# Patient Record
Sex: Female | Born: 1970 | Smoking: Never smoker
Health system: Southern US, Community
[De-identification: ages and names within clinical notes are randomized; demographics above are authoritative.]

## PROBLEM LIST (undated history)

## (undated) DIAGNOSIS — I10 Essential (primary) hypertension: Secondary | ICD-10-CM

---

## 2016-08-01 ENCOUNTER — Other Ambulatory Visit: Payer: Self-pay | Admitting: Obstetrics and Gynecology

## 2016-08-01 ENCOUNTER — Other Ambulatory Visit (HOSPITAL_COMMUNITY)
Admission: RE | Admit: 2016-08-01 | Discharge: 2016-08-01 | Disposition: A | Discharge status: Home / Self Care | Payer: 59 | Source: Ambulatory Visit | Attending: Obstetrics and Gynecology | Admitting: Obstetrics and Gynecology

## 2016-08-01 DIAGNOSIS — Z1151 Encounter for screening for human papillomavirus (HPV): Secondary | ICD-10-CM | POA: Insufficient documentation

## 2016-08-01 DIAGNOSIS — Z01419 Encounter for gynecological examination (general) (routine) without abnormal findings: Secondary | ICD-10-CM | POA: Insufficient documentation

## 2016-08-05 LAB — CYTOLOGY - PAP
DIAGNOSIS: NEGATIVE
HPV: NOT DETECTED

## 2019-08-03 ENCOUNTER — Other Ambulatory Visit: Payer: Self-pay

## 2019-08-08 ENCOUNTER — Ambulatory Visit: Payer: 59 | Attending: Internal Medicine

## 2019-08-08 DIAGNOSIS — Z20822 Contact with and (suspected) exposure to covid-19: Secondary | ICD-10-CM

## 2019-08-10 LAB — NOVEL CORONAVIRUS, NAA: SARS-CoV-2, NAA: DETECTED — AB

## 2020-02-01 ENCOUNTER — Other Ambulatory Visit: Payer: Self-pay | Admitting: Obstetrics and Gynecology

## 2020-02-01 DIAGNOSIS — Z1231 Encounter for screening mammogram for malignant neoplasm of breast: Secondary | ICD-10-CM

## 2020-02-06 ENCOUNTER — Other Ambulatory Visit: Payer: Self-pay

## 2020-02-06 ENCOUNTER — Ambulatory Visit
Admission: RE | Admit: 2020-02-06 | Discharge: 2020-02-06 | Disposition: A | Discharge status: Home / Self Care | Payer: 59 | Source: Ambulatory Visit

## 2020-02-06 DIAGNOSIS — Z1231 Encounter for screening mammogram for malignant neoplasm of breast: Secondary | ICD-10-CM

## 2020-08-12 ENCOUNTER — Encounter (HOSPITAL_COMMUNITY): Payer: Self-pay

## 2020-08-12 ENCOUNTER — Emergency Department (HOSPITAL_COMMUNITY)
Admission: EM | Admit: 2020-08-12 | Discharge: 2020-08-12 | Disposition: A | Discharge status: Home / Self Care | Payer: 59 | Attending: Emergency Medicine | Admitting: Emergency Medicine

## 2020-08-12 ENCOUNTER — Other Ambulatory Visit: Payer: Self-pay

## 2020-08-12 DIAGNOSIS — I10 Essential (primary) hypertension: Secondary | ICD-10-CM | POA: Insufficient documentation

## 2020-08-12 DIAGNOSIS — L509 Urticaria, unspecified: Secondary | ICD-10-CM | POA: Insufficient documentation

## 2020-08-12 HISTORY — DX: Essential (primary) hypertension: I10

## 2020-08-12 LAB — CBC
HCT: 49.2 % — ABNORMAL HIGH (ref 36.0–46.0)
Hemoglobin: 16 g/dL — ABNORMAL HIGH (ref 12.0–15.0)
MCH: 30.4 pg (ref 26.0–34.0)
MCHC: 32.5 g/dL (ref 30.0–36.0)
MCV: 93.4 fL (ref 80.0–100.0)
Platelets: 290 10*3/uL (ref 150–400)
RBC: 5.27 MIL/uL — ABNORMAL HIGH (ref 3.87–5.11)
RDW: 12.3 % (ref 11.5–15.5)
WBC: 12.5 10*3/uL — ABNORMAL HIGH (ref 4.0–10.5)
nRBC: 0 % (ref 0.0–0.2)

## 2020-08-12 LAB — CBC WITH DIFFERENTIAL/PLATELET
Abs Immature Granulocytes: 0.04 10*3/uL (ref 0.00–0.07)
Basophils Absolute: 0 10*3/uL (ref 0.0–0.1)
Basophils Relative: 0 %
Eosinophils Absolute: 0.1 10*3/uL (ref 0.0–0.5)
Eosinophils Relative: 1 %
HCT: 49.4 % — ABNORMAL HIGH (ref 36.0–46.0)
Hemoglobin: 16 g/dL — ABNORMAL HIGH (ref 12.0–15.0)
Immature Granulocytes: 0 %
Lymphocytes Relative: 17 %
Lymphs Abs: 2.1 10*3/uL (ref 0.7–4.0)
MCH: 30.4 pg (ref 26.0–34.0)
MCHC: 32.4 g/dL (ref 30.0–36.0)
MCV: 93.7 fL (ref 80.0–100.0)
Monocytes Absolute: 0.4 10*3/uL (ref 0.1–1.0)
Monocytes Relative: 3 %
Neutro Abs: 9.6 10*3/uL — ABNORMAL HIGH (ref 1.7–7.7)
Neutrophils Relative %: 79 %
Platelets: 293 10*3/uL (ref 150–400)
RBC: 5.27 MIL/uL — ABNORMAL HIGH (ref 3.87–5.11)
RDW: 12.4 % (ref 11.5–15.5)
WBC: 12.3 10*3/uL — ABNORMAL HIGH (ref 4.0–10.5)
nRBC: 0 % (ref 0.0–0.2)

## 2020-08-12 LAB — COMPREHENSIVE METABOLIC PANEL
ALT: 21 U/L (ref 0–44)
AST: 22 U/L (ref 15–41)
Albumin: 4.1 g/dL (ref 3.5–5.0)
Alkaline Phosphatase: 75 U/L (ref 38–126)
Anion gap: 12 (ref 5–15)
BUN: 11 mg/dL (ref 6–20)
CO2: 20 mmol/L — ABNORMAL LOW (ref 22–32)
Calcium: 8.7 mg/dL — ABNORMAL LOW (ref 8.9–10.3)
Chloride: 104 mmol/L (ref 98–111)
Creatinine, Ser: 0.77 mg/dL (ref 0.44–1.00)
GFR, Estimated: >60 mL/min (ref >60)
Glucose, Bld: 162 mg/dL — ABNORMAL HIGH (ref 70–99)
Potassium: 4 mmol/L (ref 3.5–5.1)
Sodium: 136 mmol/L (ref 135–145)
Total Bilirubin: 0.8 mg/dL (ref 0.3–1.2)
Total Protein: 7.1 g/dL (ref 6.5–8.1)

## 2020-08-12 MED ORDER — HYDROXYZINE HCL 25 MG PO TABS: 25.0000 mg | ORAL_TABLET | Freq: Four times a day (QID) | ORAL | 0 refills | Status: AC

## 2020-08-12 MED ORDER — FAMOTIDINE 20 MG PO TABS: 20.0000 mg | ORAL_TABLET | Freq: Two times a day (BID) | ORAL | 0 refills | Status: AC

## 2020-08-12 MED ORDER — SODIUM CHLORIDE 0.9 % IV BOLUS
1000.0000 mL | Freq: Once | INTRAVENOUS | Status: AC
  Administered 2020-08-12: 1000 mL via INTRAVENOUS

## 2020-08-12 MED ORDER — EPINEPHRINE 0.3 MG/0.3ML IJ SOAJ: 0.3000 mg | Freq: Once | INTRAMUSCULAR | 1 refills | Status: AC | PRN

## 2020-08-12 MED ORDER — LEVOCETIRIZINE DIHYDROCHLORIDE 5 MG PO TABS: 5.0000 mg | ORAL_TABLET | Freq: Every evening | ORAL | 0 refills | Status: AC

## 2020-08-12 MED ORDER — FAMOTIDINE IN NACL 20-0.9 MG/50ML-% IV SOLN
20.0000 mg | Freq: Once | INTRAVENOUS | Status: AC
  Administered 2020-08-12: 20 mg via INTRAVENOUS
  Filled 2020-08-12: qty 50

## 2020-08-12 MED ORDER — DIPHENHYDRAMINE HCL 25 MG PO CAPS
25.0000 mg | ORAL_CAPSULE | Freq: Once | ORAL | Status: AC
  Administered 2020-08-12: 25 mg via ORAL
  Filled 2020-08-12: qty 1

## 2020-08-12 MED ORDER — METHYLPREDNISOLONE 4 MG PO TBPK: ORAL_TABLET | ORAL | 0 refills | Status: AC

## 2020-08-12 MED ORDER — METHYLPREDNISOLONE SODIUM SUCC 125 MG IJ SOLR
125.0000 mg | Freq: Once | INTRAMUSCULAR | Status: AC
  Administered 2020-08-12: 125 mg via INTRAVENOUS
  Filled 2020-08-12: qty 2

## 2020-08-12 NOTE — ED Notes (Signed)
Pt arrived to hallway 12 with redness and welts all over her body. Her right eye was so swollen that it was almost shut. No respiratory distress, no respiratory compromise. Pt able to complete sentences without any issues. Pt said her lips were swollen on arrival but got better after benadryl was given at triage.

## 2020-08-12 NOTE — ED Notes (Signed)
Patient placed on monitor, Pulse Ox.

## 2020-08-12 NOTE — ED Triage Notes (Addendum)
Pt BIB husband due to allergic reaction . Pt  reports she notice x2 weeks ago that she had 2 bumps on her back and it went away. Pt reports she thought she got bit by a spider. Pt states Friday the itching got worse. Pt reports Friday that she got a burning sensation and itching. Pt reports today the rash is worse and has spread to her entire body. Pt reports she hasn't changed foods, laundry detergent or soaps.Pt airway intact.Pt is able to speak in complete sentences. Pt refused interpreter.

## 2020-08-12 NOTE — Discharge Instructions (Signed)
  General instructions Do not take hot showers or baths. This can make itching worse. Do not wear tight-fitting clothing. Use sunscreen and wear protective clothing when you are outside. Avoid any substances that cause your hives. Keep a journal to help track what causes your hives. Write down: What medicines you take. What you eat and drink. What products you use on your skin. Keep all follow-up visits as told by your health care provider. This is important. Contact a health care provider if: Your symptoms are not controlled with medicine. Your joints are painful or swollen. Get help right away if: You have a fever. You have pain in your abdomen. Your tongue or lips are swollen. Your eyelids are swollen. Your chest or throat feels tight. You have trouble breathing or swallowing.

## 2020-08-12 NOTE — ED Provider Notes (Signed)
[facility] EMERGENCY DEPARTMENT Provider Note   CSN: [id number] Arrival date & time: 08/12/20  4166     History Chief Complaint  Patient presents with  . Allergic Reaction    Kara Shere Sr. is a 50 y.o. female who presents with chief complaint of hives.  The patient has had 1 previous episode of this back in November but this was reportedly only to her scalp and occurred after changing laundry detergents.  This resolved within 24 hours.  Patient had onset of hives 2 days ago which have progressively worsened on the scalp face lips neck torso and extremities.  She denies wheezing, coughing or shortness of breath.  She has no previous known allergies.  She is unsure what caused the onset of these 2 days ago.  She denies fevers, chills.  She denies nausea, vomiting or syncope.  HPI     Past Medical History:  Diagnosis Date  . Hypertension     There are no problems to display for this patient.   History reviewed. No pertinent surgical history.   OB History   No obstetric history on file.     History reviewed. No pertinent family history.  Social History   Tobacco Use  . Smoking status: Never Smoker  . Smokeless tobacco: Never Used    Home Medications Prior to Admission medications   Medication Sig Start Date End Date Taking? Authorizing Provider  EPINEPHrine (EPIPEN 2-PAK) 0.3 mg/0.3 mL IJ SOAJ injection Inject 0.3 mg into the muscle once as needed for up to 1 dose (for severe allergic reaction). CAll 911 immediately if you have to use this medicine 08/12/20  Yes Irina Okelly, PA-C  famotidine (PEPCID) 20 MG tablet Take 1 tablet (20 mg total) by mouth 2 (two) times daily. 08/12/20  Yes Jaylene Schrom, PA-C  hydrOXYzine (ATARAX/VISTARIL) 25 MG tablet Take 1 tablet (25 mg total) by mouth every 6 (six) hours. 08/12/20  Yes Amran Malter, PA-C  levocetirizine (XYZAL) 5 MG tablet Take 1 tablet (5 mg total) by mouth every evening. 08/12/20   Yes Arthor Captain, PA-C  methylPREDNISolone (MEDROL DOSEPAK) 4 MG TBPK tablet Use as directed 08/12/20  Yes Arthor Captain, PA-C    Allergies    Patient has no allergy information on record.  Review of Systems   Review of Systems Ten systems reviewed and are negative for acute change, except as noted in the HPI.   Physical Exam Updated Vital Signs BP 133/79   Pulse 68   Temp 98.5 F (36.9 C) (Oral)   Resp 16   LMP 08/06/2020   SpO2 98%   Physical Exam Vitals and nursing note reviewed.  Constitutional:      General: She is not in acute distress.    Appearance: She is well-developed and well-nourished. She is not diaphoretic.  HENT:     Head: Normocephalic and atraumatic.  Eyes:     General: No scleral icterus.    Conjunctiva/sclera: Conjunctivae normal.  Cardiovascular:     Rate and Rhythm: Normal rate and regular rhythm.     Heart sounds: Normal heart sounds. No murmur heard. No friction rub. No gallop.   Pulmonary:     Effort: Pulmonary effort is normal. No respiratory distress.     Breath sounds: Normal breath sounds. No wheezing.  Abdominal:     General: Bowel sounds are normal. There is no distension.     Palpations: Abdomen is soft. There is no mass.     Tenderness: There  is no abdominal tenderness. There is no guarding.  Musculoskeletal:     Cervical back: Normal range of motion.  Skin:    General: Skin is warm and dry.     Findings: Rash present. Rash is urticarial.     Comments: Diffuse confluent hives over the face, scalp, neck and upper torso.  There are some singular wheals on the distal upper extremities.  Confluent hives covering the thorax.  Again noted are singular wheals over the lower extremities.  No purpura, no palmar lesions.  Neurological:     Mental Status: She is alert and oriented to person, place, and time.  Psychiatric:        Behavior: Behavior normal.     ED Results / Procedures / Treatments   Labs (all labs ordered are listed,  but only abnormal results are displayed) Labs Reviewed  CBC - Abnormal; Notable for the following components:      Result Value   WBC 12.5 (*)    RBC 5.27 (*)    Hemoglobin 16.0 (*)    HCT 49.2 (*)    All other components within normal limits  COMPREHENSIVE METABOLIC PANEL - Abnormal; Notable for the following components:   CO2 20 (*)    Glucose, Bld 162 (*)    Calcium 8.7 (*)    All other components within normal limits  CBC WITH DIFFERENTIAL/PLATELET - Abnormal; Notable for the following components:   WBC 12.3 (*)    RBC 5.27 (*)    Hemoglobin 16.0 (*)    HCT 49.4 (*)    Neutro Abs 9.6 (*)    All other components within normal limits    EKG EKG Interpretation  Date/Time:  Sunday August 12 2020 07:04:41 EST Ventricular Rate:  124 PR Interval:  148 QRS Duration: 78 QT Interval:  308 QTC Calculation: 442 R Axis:   58 Text Interpretation: Sinus tachycardia Septal infarct , age undetermined No previous tracing Confirmed by Gwyneth Sprout ([id number]) on 08/12/2020 10:09:45 AM   Radiology No results found.  Procedures Procedures   Medications Ordered in ED Medications  diphenhydrAMINE (BENADRYL) capsule 25 mg (25 mg Oral Given 08/12/20 0707)  methylPREDNISolone sodium succinate (SOLU-MEDROL) 125 mg/2 mL injection 125 mg (125 mg Intravenous Given 08/12/20 0825)  sodium chloride 0.9 % bolus 1,000 mL (0 mLs Intravenous Stopped 08/12/20 0920)  famotidine (PEPCID) IVPB 20 mg premix (0 mg Intravenous Stopped 08/12/20 3335)    ED Course  I have reviewed the triage vital signs and the nursing notes.  Pertinent labs & imaging results that were available during my care of the patient were reviewed by me and considered in my medical decision making (see chart for details).  Clinical Course as of 08/12/20 1337  Sun Aug 12, 2020  0915 A febrile with normal eosinophil count- Doubt DRESS  [AH]    Clinical Course User Index [AH] Arthor Captain, PA-C   MDM Rules/Calculators/A&P                           Patient here with Diffuse hives outbreak. No evidence of ananphylaxis. Paitent initially tachycardic but sxs resolved after fluids, tx with solumedrol, benadryl, pepcid. Patient's symptoms have improved significantly.  I doubt any other emergent cause such as dress, SJS, TENS, contact dermatitis.  I ordered and reviewed labs which include CBC which shows slightly elevated white blood cell count no any eosinophilia, CMP with mildly elevated blood glucose 230 likely acute phase reactants.  EKG initially shows sinus tachycardia at a rate of 124. Patient seen and shared visit.  Will discharge with an EpiPen to have should her symptoms worsen, Medrol Dosepak, Pepcid, Zyrtec, Atarax as needed for itching.  Discussed outpatient follow-up and return precautions.  Final Clinical Impression(s) / ED Diagnoses Final diagnoses:  Urticaria    Rx / DC Orders ED Discharge Orders         Ordered    methylPREDNISolone (MEDROL DOSEPAK) 4 MG TBPK tablet        08/12/20 1122    hydrOXYzine (ATARAX/VISTARIL) 25 MG tablet  Every 6 hours        08/12/20 1122    famotidine (PEPCID) 20 MG tablet  2 times daily        08/12/20 1122    levocetirizine (XYZAL) 5 MG tablet  Every evening        08/12/20 1127    EPINEPHrine (EPIPEN 2-PAK) 0.3 mg/0.3 mL IJ SOAJ injection  Once PRN        08/12/20 1128           Arthor Captain, PA-C 08/12/20 1337    Gwyneth Sprout, MD 08/16/20 [phone]

## 2021-03-06 ENCOUNTER — Other Ambulatory Visit: Payer: Self-pay | Admitting: Obstetrics and Gynecology

## 2021-03-06 DIAGNOSIS — Z1231 Encounter for screening mammogram for malignant neoplasm of breast: Secondary | ICD-10-CM

## 2021-03-08 ENCOUNTER — Ambulatory Visit
Admission: RE | Admit: 2021-03-08 | Discharge: 2021-03-08 | Disposition: A | Discharge status: Home / Self Care | Payer: 59 | Source: Ambulatory Visit | Attending: Obstetrics and Gynecology | Admitting: Obstetrics and Gynecology

## 2021-03-08 ENCOUNTER — Other Ambulatory Visit: Payer: Self-pay

## 2021-03-08 DIAGNOSIS — Z1231 Encounter for screening mammogram for malignant neoplasm of breast: Secondary | ICD-10-CM

## 2021-12-30 ENCOUNTER — Ambulatory Visit (INDEPENDENT_AMBULATORY_CARE_PROVIDER_SITE_OTHER): Payer: 59

## 2021-12-30 ENCOUNTER — Ambulatory Visit (INDEPENDENT_AMBULATORY_CARE_PROVIDER_SITE_OTHER): Payer: 59 | Admitting: Podiatry

## 2021-12-30 DIAGNOSIS — M21612 Bunion of left foot: Secondary | ICD-10-CM | POA: Diagnosis not present

## 2021-12-30 DIAGNOSIS — M21619 Bunion of unspecified foot: Secondary | ICD-10-CM

## 2021-12-30 DIAGNOSIS — M216X1 Other acquired deformities of right foot: Secondary | ICD-10-CM | POA: Diagnosis not present

## 2021-12-30 DIAGNOSIS — M7662 Achilles tendinitis, left leg: Secondary | ICD-10-CM | POA: Diagnosis not present

## 2021-12-30 DIAGNOSIS — M2011 Hallux valgus (acquired), right foot: Secondary | ICD-10-CM | POA: Diagnosis not present

## 2021-12-30 DIAGNOSIS — M7732 Calcaneal spur, left foot: Secondary | ICD-10-CM | POA: Diagnosis not present

## 2021-12-30 DIAGNOSIS — M7661 Achilles tendinitis, right leg: Secondary | ICD-10-CM | POA: Diagnosis not present

## 2021-12-30 DIAGNOSIS — M216X2 Other acquired deformities of left foot: Secondary | ICD-10-CM

## 2021-12-30 MED ORDER — MELOXICAM 7.5 MG PO TABS: 7.5000 mg | ORAL_TABLET | Freq: Every day | ORAL | 0 refills | Status: AC | PRN

## 2021-12-30 NOTE — Patient Instructions (Signed)

## 2022-01-08 ENCOUNTER — Other Ambulatory Visit: Payer: Self-pay | Admitting: Podiatry

## 2022-01-08 DIAGNOSIS — M2011 Hallux valgus (acquired), right foot: Secondary | ICD-10-CM

## 2022-02-24 ENCOUNTER — Ambulatory Visit: Payer: 59 | Admitting: Podiatry

## 2022-03-14 ENCOUNTER — Other Ambulatory Visit: Payer: Self-pay | Admitting: Obstetrics and Gynecology

## 2022-03-14 DIAGNOSIS — Z1231 Encounter for screening mammogram for malignant neoplasm of breast: Secondary | ICD-10-CM

## 2022-03-20 ENCOUNTER — Ambulatory Visit
Admission: RE | Admit: 2022-03-20 | Discharge: 2022-03-20 | Disposition: A | Discharge status: Home / Self Care | Payer: 59 | Source: Ambulatory Visit | Attending: Obstetrics and Gynecology | Admitting: Obstetrics and Gynecology

## 2022-03-20 DIAGNOSIS — Z1231 Encounter for screening mammogram for malignant neoplasm of breast: Secondary | ICD-10-CM

## 2022-11-28 ENCOUNTER — Other Ambulatory Visit: Payer: Self-pay | Admitting: Obstetrics and Gynecology

## 2022-11-28 DIAGNOSIS — Z1231 Encounter for screening mammogram for malignant neoplasm of breast: Secondary | ICD-10-CM

## 2023-03-23 ENCOUNTER — Ambulatory Visit
Admission: RE | Admit: 2023-03-23 | Discharge: 2023-03-23 | Disposition: A | Discharge status: Home / Self Care | Payer: 59 | Source: Ambulatory Visit | Attending: Obstetrics and Gynecology | Admitting: Obstetrics and Gynecology

## 2023-03-23 DIAGNOSIS — Z1231 Encounter for screening mammogram for malignant neoplasm of breast: Secondary | ICD-10-CM

## 2023-04-01 IMAGING — MG MM DIGITAL SCREENING BILAT W/ TOMO AND CAD
8 series · 8 of 24 positions shown · non-contrast
Comparison: Previous exam(s).

CLINICAL DATA: Screening.

EXAM:
DIGITAL SCREENING BILATERAL MAMMOGRAM WITH TOMOSYNTHESIS AND CAD
TECHNIQUE: Bilateral screening digital craniocaudal and mediolateral oblique
mammograms were obtained. Bilateral screening digital breast
tomosynthesis was performed. The images were evaluated with
computer-aided detection.

[L MLO synth-2D]
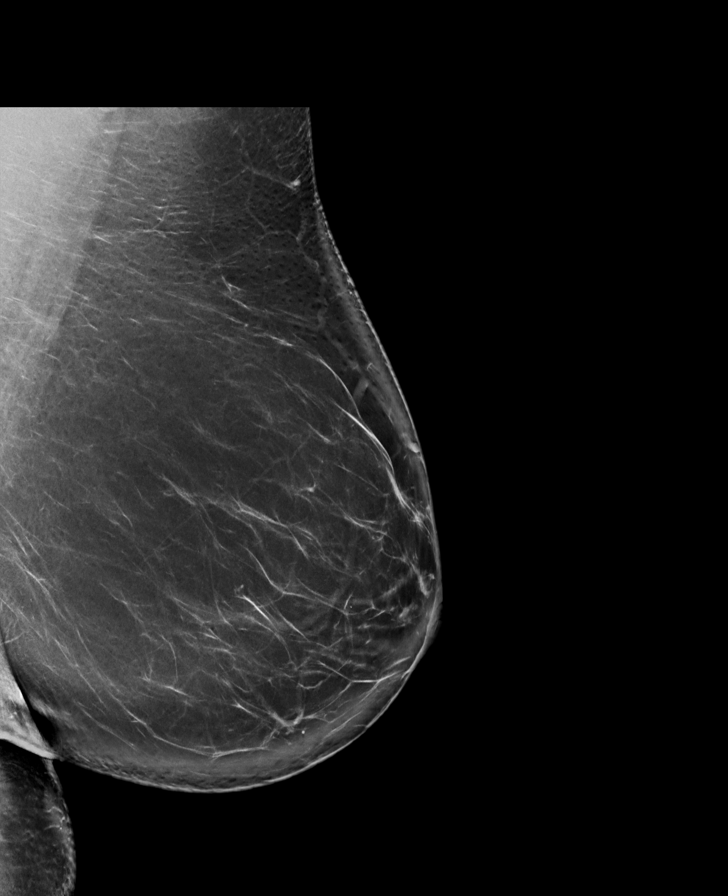

[R MLO synth-2D]
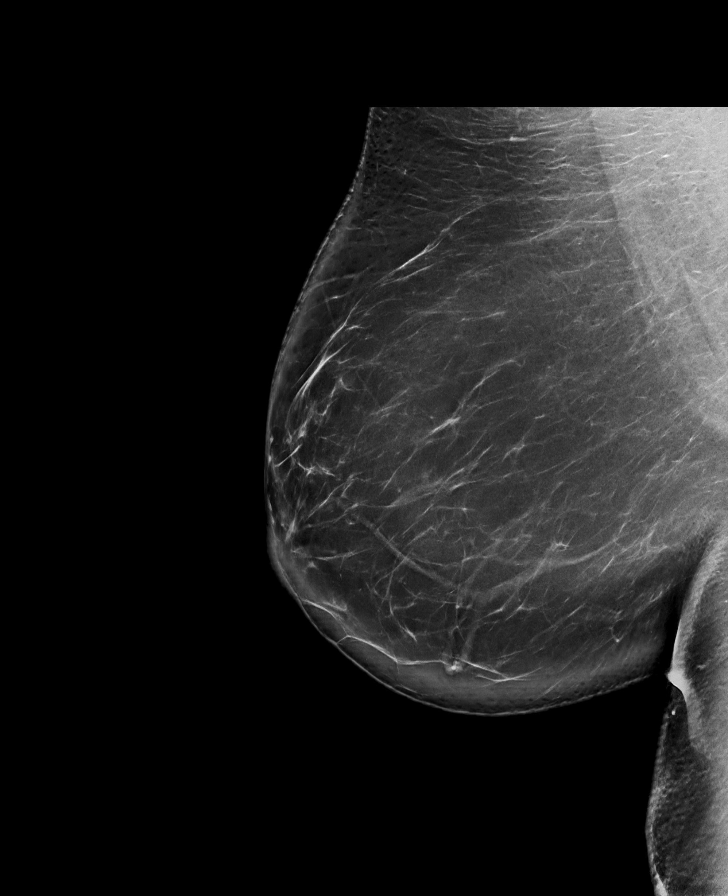

[L CC synth-2D]
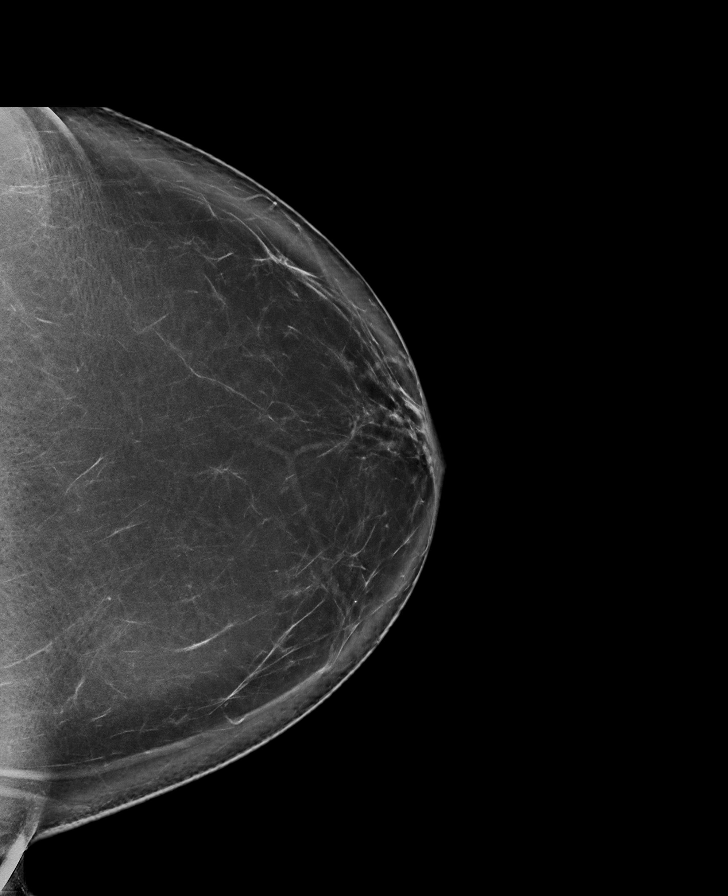

[R CC synth-2D]
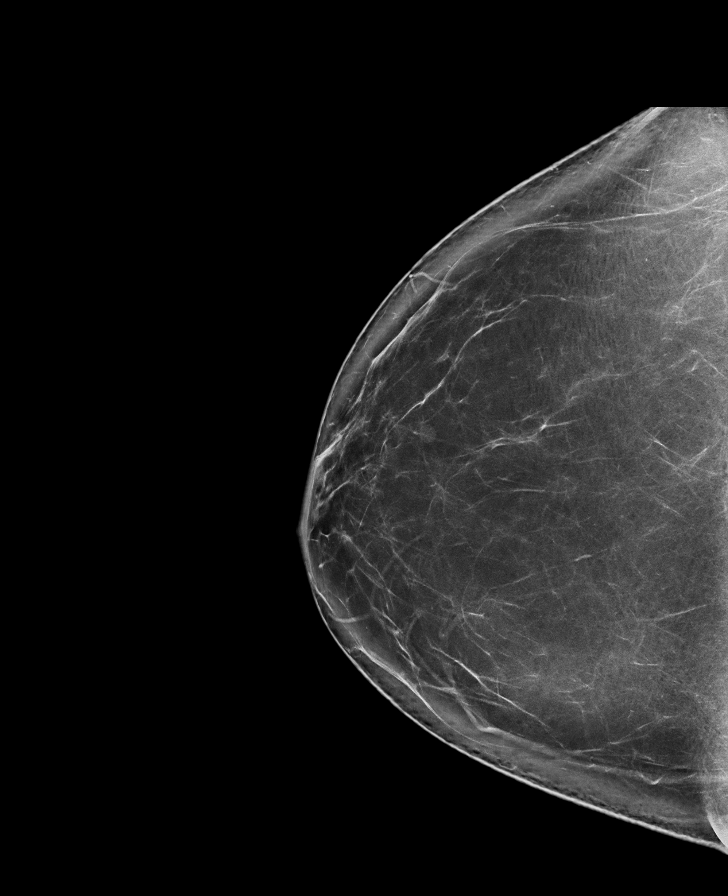

[R MLO tomo · tomo slice 56/111.0]
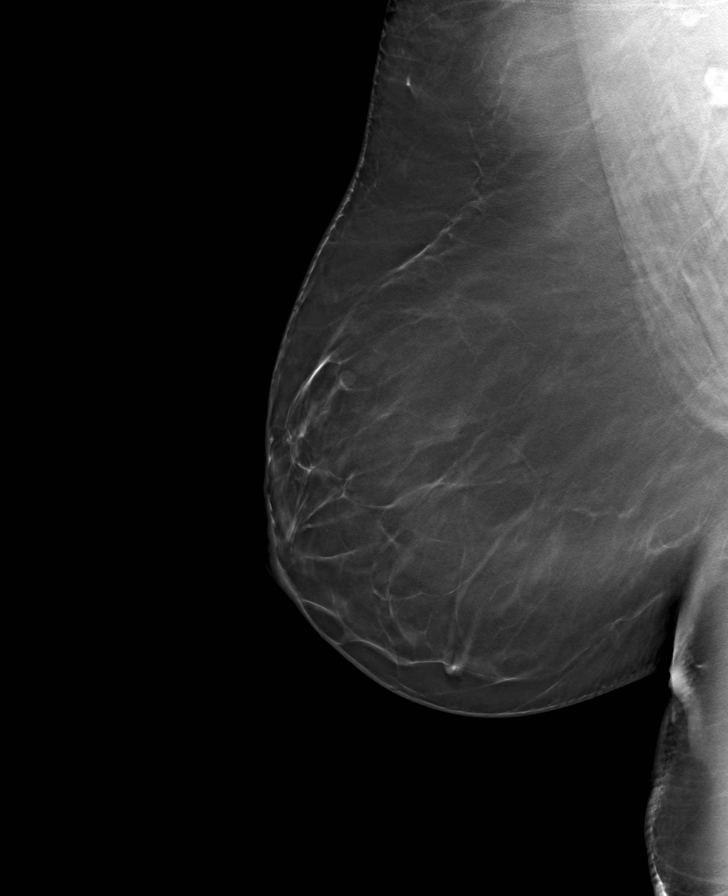

[L MLO tomo · tomo slice 55/110.0]
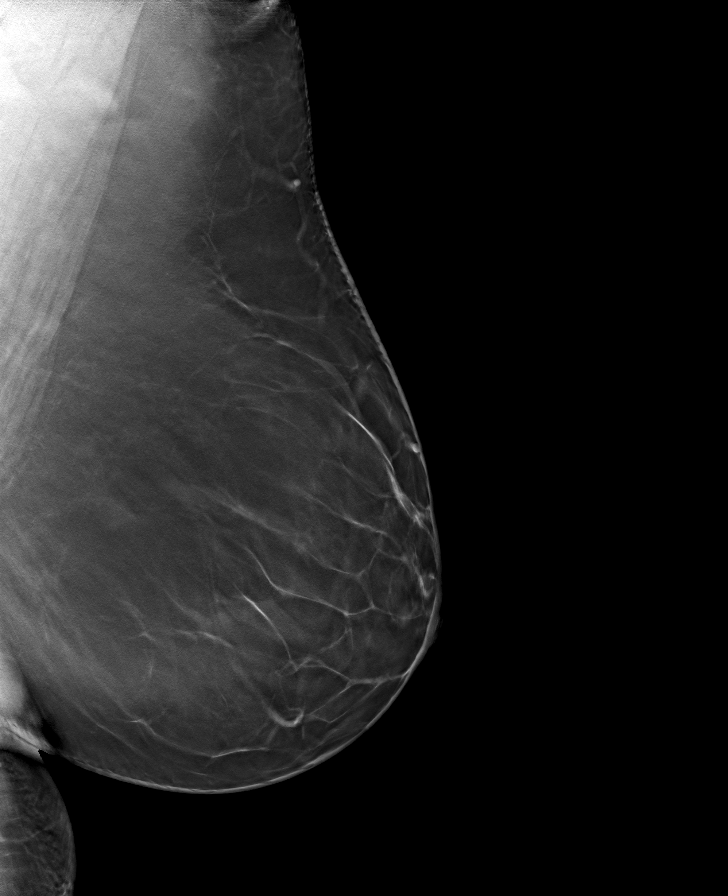

[R CC tomo · tomo slice 51/100.0]
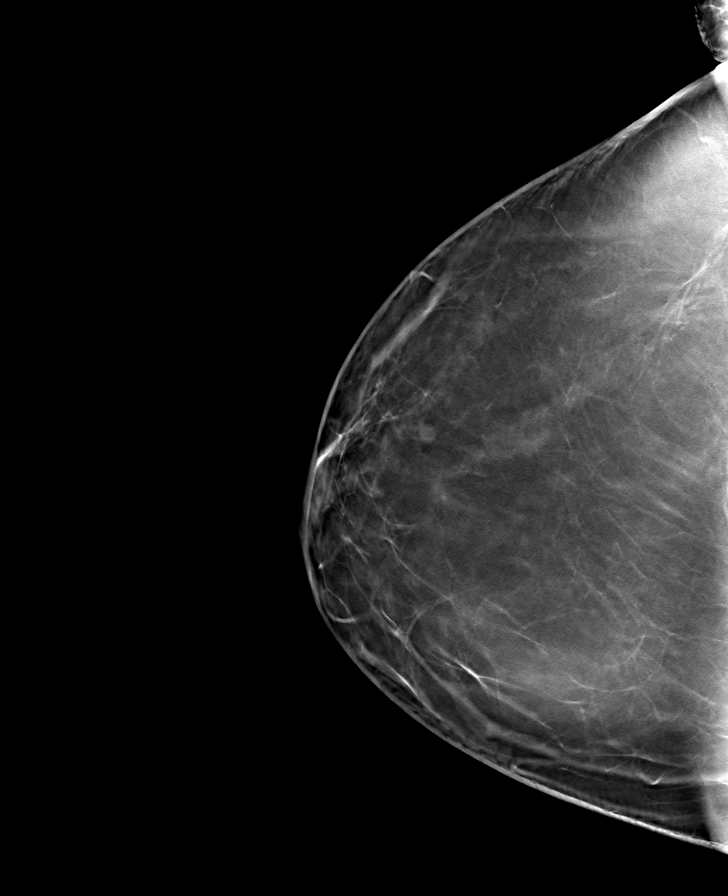

[L CC tomo · tomo slice 51/102.0]
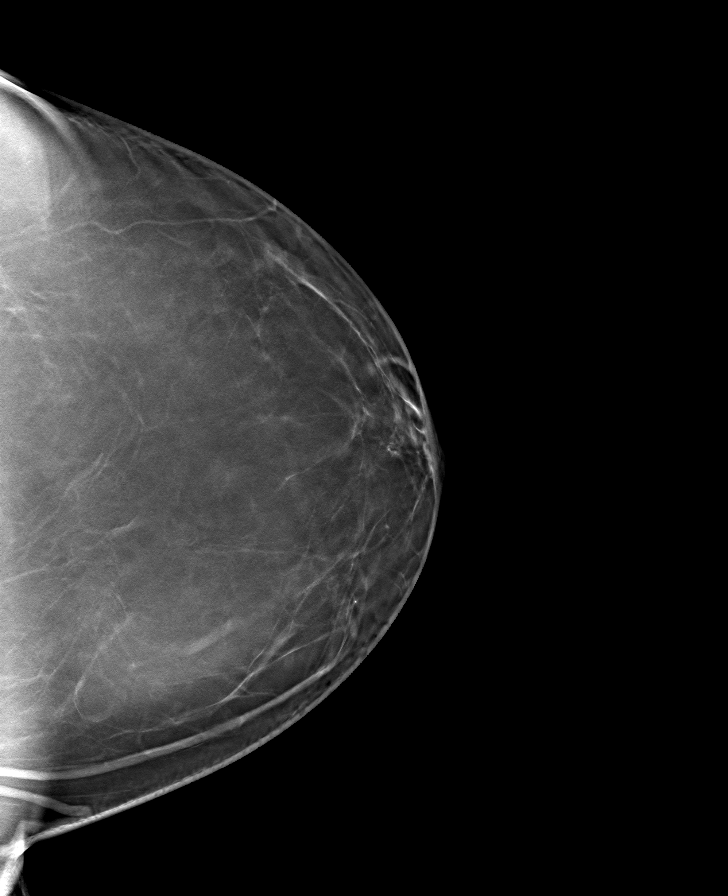

[8 of 24 positions shown; findings below may reference images not displayed]

ACR Breast Density Category b: There are scattered areas of
fibroglandular density.
FINDINGS: There are no findings suspicious for malignancy.
IMPRESSION: No mammographic evidence of malignancy. A result letter of this
screening mammogram will be mailed directly to the patient.

RECOMMENDATION:
Screening mammogram in one year. (Code:51-O-LD2)

BI-RADS CATEGORY  1: Negative.

## 2023-09-21 ENCOUNTER — Other Ambulatory Visit: Payer: Self-pay | Admitting: Obstetrics and Gynecology

## 2023-09-21 DIAGNOSIS — Z1231 Encounter for screening mammogram for malignant neoplasm of breast: Secondary | ICD-10-CM

## 2024-03-23 ENCOUNTER — Ambulatory Visit
Admission: RE | Admit: 2024-03-23 | Discharge: 2024-03-23 | Disposition: A | Discharge status: Home / Self Care | Source: Ambulatory Visit | Attending: Obstetrics and Gynecology | Admitting: Obstetrics and Gynecology

## 2024-03-23 DIAGNOSIS — Z1231 Encounter for screening mammogram for malignant neoplasm of breast: Secondary | ICD-10-CM

## 2024-03-31 ENCOUNTER — Other Ambulatory Visit: Payer: Self-pay | Admitting: Obstetrics and Gynecology

## 2024-03-31 ENCOUNTER — Other Ambulatory Visit (HOSPITAL_COMMUNITY)
Admission: RE | Admit: 2024-03-31 | Discharge: 2024-03-31 | Disposition: A | Discharge status: Home / Self Care | Source: Ambulatory Visit | Attending: Obstetrics and Gynecology | Admitting: Obstetrics and Gynecology

## 2024-03-31 DIAGNOSIS — Z01419 Encounter for gynecological examination (general) (routine) without abnormal findings: Secondary | ICD-10-CM | POA: Insufficient documentation

## 2024-04-04 LAB — CYTOLOGY - PAP
Comment: NEGATIVE
Diagnosis: NEGATIVE
High risk HPV: NEGATIVE

## 2024-04-11 ENCOUNTER — Emergency Department (HOSPITAL_BASED_OUTPATIENT_CLINIC_OR_DEPARTMENT_OTHER)

## 2024-04-11 ENCOUNTER — Emergency Department (HOSPITAL_BASED_OUTPATIENT_CLINIC_OR_DEPARTMENT_OTHER): Admitting: Radiology

## 2024-04-11 ENCOUNTER — Other Ambulatory Visit: Payer: Self-pay

## 2024-04-11 ENCOUNTER — Emergency Department (HOSPITAL_BASED_OUTPATIENT_CLINIC_OR_DEPARTMENT_OTHER): Admission: EM | Admit: 2024-04-11 | Discharge: 2024-04-11 | Disposition: A | Discharge status: Home / Self Care

## 2024-04-11 ENCOUNTER — Encounter (HOSPITAL_BASED_OUTPATIENT_CLINIC_OR_DEPARTMENT_OTHER): Payer: Self-pay | Admitting: Urology

## 2024-04-11 DIAGNOSIS — I10 Essential (primary) hypertension: Secondary | ICD-10-CM | POA: Insufficient documentation

## 2024-04-11 DIAGNOSIS — R202 Paresthesia of skin: Secondary | ICD-10-CM | POA: Insufficient documentation

## 2024-04-11 LAB — BASIC METABOLIC PANEL WITH GFR
Anion gap: 15 (ref 5–15)
BUN: 13 mg/dL (ref 6–20)
CO2: 22 mmol/L (ref 22–32)
Calcium: 9.7 mg/dL (ref 8.9–10.3)
Chloride: 102 mmol/L (ref 98–111)
Creatinine, Ser: 0.79 mg/dL (ref 0.44–1.00)
GFR, Estimated: >60 mL/min (ref >60)
Glucose, Bld: 92 mg/dL (ref 70–99)
Potassium: 3.9 mmol/L (ref 3.5–5.1)
Sodium: 139 mmol/L (ref 135–145)

## 2024-04-11 LAB — CBC
HCT: 42 % (ref 36.0–46.0)
Hemoglobin: 14.2 g/dL (ref 12.0–15.0)
MCH: 30.9 pg (ref 26.0–34.0)
MCHC: 33.8 g/dL (ref 30.0–36.0)
MCV: 91.5 fL (ref 80.0–100.0)
Platelets: 217 K/uL (ref 150–400)
RBC: 4.59 MIL/uL (ref 3.87–5.11)
RDW: 12.5 % (ref 11.5–15.5)
WBC: 8.7 K/uL (ref 4.0–10.5)
nRBC: 0 % (ref 0.0–0.2)

## 2024-04-11 LAB — TROPONIN T, HIGH SENSITIVITY: Troponin T High Sensitivity: <15 ng/L (ref 0–19)

## 2024-04-11 MED ORDER — METHOCARBAMOL 500 MG PO TABS: 500.0000 mg | ORAL_TABLET | Freq: Two times a day (BID) | ORAL | 0 refills | Status: AC

## 2024-04-11 NOTE — ED Notes (Signed)
 Provider at bedside for MSE.

## 2024-04-11 NOTE — ED Notes (Signed)
 Attempt at IV x 2 with no result, provider aware

## 2024-04-11 NOTE — ED Provider Triage Note (Signed)
 Emergency Medicine Provider Triage Evaluation Note  Kara Booth , a 52 y.o. female  was evaluated in triage.  Pt complains of intermittent L arm tingling radiating from L shoulder x 3 days accompanied by  headache, R sided. Family member at bedside who relays story.   Family concerned for MI and want cardiac labs.   Denies blurry vision, vertigo, leg weakness, chest pain, shortness of breath, abdominal pain, n/v/d, dysuria, LE swelling   Review of Systems  Positive: N/a Negative: N/a  Physical Exam  BP (!) 154/101   Pulse 86   Temp 98.4 F (36.9 C)   Resp 16   Ht 5' 3 (1.6 m)   Wt 102.1 kg   LMP 08/06/2020   SpO2 100%   BMI 39.86 kg/m  Gen:   Awake, no distress   Resp:  Normal effort  MSK:   Moves extremities without difficulty  Other:  Normal neuro exam, normal coordination, grip strength, hip/knee/elbow extension and flexion, no nystagmus, no arm drift. No aphasia or apraxia.   Medical Decision Making  Medically screening exam initiated at 4:51 PM.  Appropriate orders placed.  Kara Booth was informed that the remainder of the evaluation will be completed by another provider, this initial triage assessment does not replace that evaluation, and the importance of remaining in the ED until their evaluation is complete.     Kara Booth, [place] 04/11/24 1655

## 2024-04-11 NOTE — ED Triage Notes (Signed)
 Pt family states pt started having weird feeling in left arm on Friday  Went away over the weekend, also had headache  States left arm numbness and left neck numbness that started this am.  Take metoprolol for HTN

## 2024-04-11 NOTE — Discharge Instructions (Signed)
 You were seen today for tingling sensation to left arm.  Your lab work, imaging and physical exam today were reassuring and low suspicion for any emergent cause recent today.  Will recommend you continue to still follow-up with your PCP for further evaluation.  I sent in a muscle relaxer for you to use as needed as well as additionally you can use Tylenol for additional relief.  Recommend heat over the shoulder and stretching to help relieve the sensations.  Return to the ED though for any new or worsening sensations which would include numbness,  worsening weakness, confusion, chest pain, shortness of breath.  Please take Robaxin, 500 mg up to twice a day as needed for muscle spasm, this is a muscle relaxer, it may cause generalized weakness, sleepiness and you should not drive or do important things while taking this medication.  Take Tylenol (acetominophen)  650mg  every 4-6 hours, as needed for pain or fever. Do not take more than 4,000 mg in a 24-hour period. As this may cause liver damage. While this is rare, if you begin to develop yellowing of the skin or eyes, stop taking and return to ER immediately.

## 2024-04-11 NOTE — ED Provider Notes (Signed)
 [place] EMERGENCY DEPARTMENT AT [facility] Provider Note   CSN: [id number] Arrival date & time: 04/11/24  1614     Patient presents with: Arm Numbness    Kara Booth is a 53 y.o. female.  HPI  Pt complains of intermittent L arm tingling radiating from L shoulder x 3 days accompanied by  headache, R sided. Family member at bedside who relays story.  Thought this may be a pinched nerve but due to symptoms, came to the ED to be evaluated.  This that she does not have any decrease in strength or sensation but says that it feels tingly.  History of HTN.   Family concerned for MI and want cardiac labs.    Denies headache, blurry vision, vertigo, leg weakness, chest pain, shortness of breath, abdominal pain, n/v/d, dysuria, LE swelling.        Prior to Admission medications   Medication Sig Start Date End Date Taking? Authorizing Provider  methocarbamol (ROBAXIN) 500 MG tablet Take 1 tablet (500 mg total) by mouth 2 (two) times daily. 04/11/24  Yes Tanija Germani S, PA-C  EPINEPHrine  (EPIPEN  2-PAK) 0.3 mg/0.3 mL IJ SOAJ injection Inject 0.3 mg into the muscle once as needed for up to 1 dose (for severe allergic reaction). CAll 911 immediately if you have to use this medicine 08/12/20   Harris, Abigail, PA-C  famotidine  (PEPCID ) 20 MG tablet Take 1 tablet (20 mg total) by mouth 2 (two) times daily. 08/12/20   Harris, Abigail, PA-C  hydrOXYzine  (ATARAX /VISTARIL ) 25 MG tablet Take 1 tablet (25 mg total) by mouth every 6 (six) hours. 08/12/20   Harris, Abigail, PA-C  levocetirizine (XYZAL ) 5 MG tablet Take 1 tablet (5 mg total) by mouth every evening. 08/12/20   Harris, Abigail, PA-C  meloxicam  (MOBIC ) 7.5 MG tablet Take 1 tablet (7.5 mg total) by mouth daily as needed for pain. 12/30/21   Gershon Donnice SAUNDERS, DPM  methylPREDNISolone  (MEDROL  DOSEPAK) 4 MG TBPK tablet Use as directed 08/12/20   Harris, Abigail, PA-C    Allergies: Ibuprofen    Review of Systems   Neurological:        Tingling sensations to left arm  All other systems reviewed and are negative.   Updated Vital Signs BP 129/76   Pulse 66   Temp 98.4 F (36.9 C)   Resp 16   Ht 5' 3 (1.6 m)   Wt 102.1 kg   LMP 08/06/2020   SpO2 100%   BMI 39.86 kg/m   Physical Exam Vitals and nursing note reviewed.  Constitutional:      General: She is not in acute distress.    Appearance: Normal appearance. She is not ill-appearing or diaphoretic.  HENT:     Head: Normocephalic and atraumatic.  Eyes:     General: No scleral icterus.       Right eye: No discharge.        Left eye: No discharge.     Extraocular Movements: Extraocular movements intact.     Conjunctiva/sclera: Conjunctivae normal.     Pupils: Pupils are equal, round, and reactive to light.  Cardiovascular:     Rate and Rhythm: Normal rate and regular rhythm.     Pulses: Normal pulses.     Heart sounds: Normal heart sounds. No murmur heard.    No friction rub. No gallop.  Pulmonary:     Effort: Pulmonary effort is normal. No respiratory distress.     Breath sounds: No stridor. No wheezing, rhonchi or  rales.  Chest:     Chest wall: No tenderness.  Abdominal:     General: Abdomen is flat. There is no distension.     Palpations: Abdomen is soft.     Tenderness: There is no abdominal tenderness. There is no right CVA tenderness, left CVA tenderness, guarding or rebound.  Musculoskeletal:        General: No swelling, deformity or signs of injury.     Cervical back: Normal range of motion. No rigidity or tenderness.     Right lower leg: No edema.     Left lower leg: No edema.     Comments: Radial pulse 2+ bilaterally  Skin:    General: Skin is warm and dry.     Findings: No bruising, erythema or lesion.  Neurological:     General: No focal deficit present.     Mental Status: She is alert and oriented to person, place, and time. Mental status is at baseline.     Sensory: No sensory deficit.     Motor: No  weakness.     Comments: No facial asymmetry, no ataxia, no apraxia, no aphasia, no arm drift, normal coordination with finger-to-nose, normal sensation to both upper and lower extremities bilaterally, normal grip strength bilaterally, normal strength to both flexion and extension to both upper lower extremities 5+ bilaterally, no visual field deficits, no nystagmus.   Psychiatric:        Mood and Affect: Mood normal.     (all labs ordered are listed, but only abnormal results are displayed) Labs Reviewed  BASIC METABOLIC PANEL WITH GFR  CBC  TROPONIN T, HIGH SENSITIVITY    EKG: EKG Interpretation Date/Time:  Monday April 11 2024 16:52:01 EDT Ventricular Rate:  77 PR Interval:  178 QRS Duration:  94 QT Interval:  388 QTC Calculation: 439 R Axis:   50  Text Interpretation: Normal sinus rhythm Cannot rule out Anterior infarct (cited on or before 12-Aug-2020) Abnormal ECG When compared with ECG of 12-Aug-2020 07:04, Vent. rate has decreased BY  47 BPM Questionable change in initial forces of Septal leads Confirmed by Neysa Clap [phone]) on 04/11/2024 7:00:36 PM  Radiology: CT Head Wo Contrast Result Date: 04/11/2024 CLINICAL DATA:  Headache EXAM: CT HEAD WITHOUT CONTRAST TECHNIQUE: Contiguous axial images were obtained from the base of the skull through the vertex without intravenous contrast. RADIATION DOSE REDUCTION: This exam was performed according to the departmental dose-optimization program which includes automated exposure control, adjustment of the mA and/or kV according to patient size and/or use of iterative reconstruction technique. COMPARISON:  None Available. FINDINGS: Brain: No evidence of acute infarction, hemorrhage, hydrocephalus, extra-axial collection or mass lesion/mass effect. Vascular: No hyperdense vessel or unexpected calcification. Skull: Normal. Negative for fracture or focal lesion. Sinuses/Orbits: No acute finding. Other: None IMPRESSION: Negative non  contrasted CT appearance of the brain. Electronically Signed   By: Luke Bun M.D.   On: 04/11/2024 17:25   DG Chest 2 View Result Date: 04/11/2024 CLINICAL DATA:  Left arm numbness EXAM: CHEST - 2 VIEW COMPARISON:  None Available. FINDINGS: The heart size and mediastinal contours are within normal limits. Both lungs are clear. Degenerative changes of the spine. IMPRESSION: No active cardiopulmonary disease. Electronically Signed   By: Luke Bun M.D.   On: 04/11/2024 17:19   Procedures   Medications Ordered in the ED - No data to display  Medical Decision Making Amount and/or Complexity of Data Reviewed Labs: ordered. Radiology: ordered.  Risk Prescription drug management.   This patient is a 53 year old female who presents to the ED for concern of left arm tingling that has been ongoing for the last 3 days accompanied with some mild headache.  Has not endorsed any weakness, numbness.  Patient's family requesting cardiac workup with fear of MI.  On physical exam, patient is in no acute distress, afebrile, alert and orient x 4, speaking in full sentences, nontachypneic, nontachycardic.  Normal neuroexam, RRR, LCTAB, no abdominal tenderness, no rashes, unremarkable exam otherwise.  With well-appearing presentation, CT scan was done and did not show any acute findings.  Lab work was also unremarkable.  Physical exam did not show any weakness or sensation loss.  Just subjective tingling.  States just of of possible radiculopathy.  From her activity that she states that she has been increasing recently.  Low suspicion for any emergent cause of symptoms at this time.  Will send home with Robaxin and have her symptomatically treated at home.  Will have her follow-up with PCP.  Patient vital signs have remained stable throughout the course of patient's time in the ED. Low suspicion for any other emergent pathology at this time. I believe this patient is safe  to be discharged. Provided strict return to ER precautions. Patient expressed agreement and understanding of plan. All questions were answered.  Differential diagnoses prior to evaluation: The emergent differential diagnosis includes, but is not limited to, CVA, ACS, cervical radiculopathy, myelopathy, carpal tunnel, cubital tunnel, lateral epicondylitis, medial epicondylitis, ischemic limb, DVT. This is not an exhaustive differential.   Past Medical History / Co-morbidities / Social History: HTN  Additional history: Chart reviewed. Pertinent results include: Last seen by PCP on 5/7/125  Lab Tests/Imaging studies: I personally interpreted labs/imaging and the pertinent results include:    CBC unremarkable BMP unremarkable Troponin undetectable CT head unremarkable Chest x-ray unremarkable.  I agree with the radiologist interpretation.  Cardiac monitoring: EKG obtained and interpreted by myself and attending physician which shows: NSR  EKG Interpretation Date/Time:  Monday April 11 2024 16:52:01 EDT Ventricular Rate:  77 PR Interval:  178 QRS Duration:  94 QT Interval:  388 QTC Calculation: 439 R Axis:   50  Text Interpretation: Normal sinus rhythm Cannot rule out Anterior infarct (cited on or before 12-Aug-2020) Abnormal ECG When compared with ECG of 12-Aug-2020 07:04, Vent. rate has decreased BY  47 BPM Questionable change in initial forces of Septal leads Confirmed by Neysa Clap [phone]) on 04/11/2024 7:00:36 PM          Medications: I ordered medication including Robaxin.  I have reviewed the patients home medicines and have made adjustments as needed.  Critical Interventions: None  Social Determinants of Health: Has good PCP follow-up  Disposition: After consideration of the diagnostic results and the patients response to treatment, I feel that the patient would benefit from discharge and treatment as above.   emergency department workup does not suggest an  emergent condition requiring admission or immediate intervention beyond what has been performed at this time. The plan is: Symptomatic management at home, return for new worsening symptoms, follow-up with PCP. The patient is safe for discharge and has been instructed to return immediately for worsening symptoms, change in symptoms or any other concerns.   Final diagnoses:  Tingling sensation    ED Discharge Orders          Ordered  methocarbamol (ROBAXIN) 500 MG tablet  2 times daily        04/11/24 2142               Beola Terrall RAMAN, [place] 04/11/24 2149    Neysa Caron PARAS, DO 04/11/24 2356
